# Patient Record
Sex: Female | Born: 1990 | Hispanic: Refuse to answer | Marital: Single | Smoking: Never smoker
Health system: Southern US, Community
[De-identification: ages and names within clinical notes are randomized; demographics above are authoritative.]

## PROBLEM LIST (undated history)

## (undated) DIAGNOSIS — I959 Hypotension, unspecified: Secondary | ICD-10-CM

## (undated) DIAGNOSIS — N83209 Unspecified ovarian cyst, unspecified side: Secondary | ICD-10-CM

## (undated) DIAGNOSIS — F32A Depression, unspecified: Secondary | ICD-10-CM

## (undated) DIAGNOSIS — R002 Palpitations: Secondary | ICD-10-CM

## (undated) DIAGNOSIS — N189 Chronic kidney disease, unspecified: Secondary | ICD-10-CM

## (undated) DIAGNOSIS — F4310 Post-traumatic stress disorder, unspecified: Secondary | ICD-10-CM

## (undated) DIAGNOSIS — K219 Gastro-esophageal reflux disease without esophagitis: Secondary | ICD-10-CM

## (undated) DIAGNOSIS — R519 Headache, unspecified: Secondary | ICD-10-CM

## (undated) DIAGNOSIS — F419 Anxiety disorder, unspecified: Secondary | ICD-10-CM

## (undated) DIAGNOSIS — J45909 Unspecified asthma, uncomplicated: Secondary | ICD-10-CM

## (undated) DIAGNOSIS — B009 Herpesviral infection, unspecified: Secondary | ICD-10-CM

## (undated) DIAGNOSIS — D649 Anemia, unspecified: Secondary | ICD-10-CM

## (undated) DIAGNOSIS — R748 Abnormal levels of other serum enzymes: Secondary | ICD-10-CM

## (undated) HISTORY — PX: CHOLECYSTECTOMY: SHX55

---

## 2022-03-01 ENCOUNTER — Encounter (HOSPITAL_COMMUNITY): Payer: Self-pay | Admitting: Obstetrics and Gynecology

## 2022-03-01 ENCOUNTER — Other Ambulatory Visit: Payer: Self-pay

## 2022-03-01 ENCOUNTER — Inpatient Hospital Stay (HOSPITAL_COMMUNITY)
Admission: AD | Admit: 2022-03-01 | Discharge: 2022-03-01 | Disposition: A | Discharge status: Home / Self Care | Payer: Medicaid Other | Attending: Obstetrics and Gynecology | Admitting: Obstetrics and Gynecology

## 2022-03-01 DIAGNOSIS — O99612 Diseases of the digestive system complicating pregnancy, second trimester: Secondary | ICD-10-CM | POA: Insufficient documentation

## 2022-03-01 DIAGNOSIS — O09212 Supervision of pregnancy with history of pre-term labor, second trimester: Secondary | ICD-10-CM | POA: Diagnosis not present

## 2022-03-01 DIAGNOSIS — K5900 Constipation, unspecified: Secondary | ICD-10-CM

## 2022-03-01 DIAGNOSIS — N3000 Acute cystitis without hematuria: Secondary | ICD-10-CM | POA: Diagnosis not present

## 2022-03-01 DIAGNOSIS — Z3A21 21 weeks gestation of pregnancy: Secondary | ICD-10-CM | POA: Diagnosis not present

## 2022-03-01 DIAGNOSIS — O2312 Infections of bladder in pregnancy, second trimester: Secondary | ICD-10-CM | POA: Insufficient documentation

## 2022-03-01 DIAGNOSIS — O212 Late vomiting of pregnancy: Secondary | ICD-10-CM | POA: Insufficient documentation

## 2022-03-01 HISTORY — DX: Hypotension, unspecified: I95.9

## 2022-03-01 HISTORY — DX: Anemia, unspecified: D64.9

## 2022-03-01 HISTORY — DX: Unspecified asthma, uncomplicated: J45.909

## 2022-03-01 HISTORY — DX: Unspecified ovarian cyst, unspecified side: N83.209

## 2022-03-01 HISTORY — DX: Palpitations: R00.2

## 2022-03-01 HISTORY — DX: Herpesviral infection, unspecified: B00.9

## 2022-03-01 LAB — URINALYSIS, ROUTINE W REFLEX MICROSCOPIC
Bilirubin Urine: NEGATIVE
Glucose, UA: NEGATIVE mg/dL
Hgb urine dipstick: NEGATIVE
Ketones, ur: NEGATIVE mg/dL
Nitrite: NEGATIVE
Protein, ur: NEGATIVE mg/dL
Specific Gravity, Urine: 1.018 (ref 1.005–1.030)
pH: 6 (ref 5.0–8.0)

## 2022-03-01 MED ORDER — CEFADROXIL 500 MG PO CAPS: 500.0000 mg | ORAL_CAPSULE | Freq: Two times a day (BID) | ORAL | 0 refills | Status: DC

## 2022-03-01 MED ORDER — BISACODYL 10 MG RE SUPP
10.0000 mg | RECTAL | 6 refills | Status: DC | PRN
Start: 2022-03-01 — End: 2022-03-10

## 2022-03-01 NOTE — MAU Provider Note (Signed)
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  Patient seen for constipation, which appears to be a chronic issue from before the pregnancy began.  This has been worsened by the patient taking Zofran due to her hyperemesis.  She has been trying to take MiraLAX and milk of magnesium without improvement.  She did have a small amount of stool last night and this afternoon.  OB History     Gravida  5   Para  1   Term      Preterm  1   AB  3   Living  1      SAB  3   IAB      Ectopic      Multiple      Live Births  1           Past Medical History:  Diagnosis Date   Anemia    Asthma    HSV-2 infection    Hypotension    Ovarian cyst    Palpitations     Past Surgical History:  Procedure Laterality Date   CHOLECYSTECTOMY      Family History  Problem Relation Age of Onset   Healthy Mother    Healthy Father     Social History   Tobacco Use   Smoking status: Never   Smokeless tobacco: Never  Vaping Use   Vaping Use: Never used  Substance Use Topics   Alcohol use: Not Currently    Comment: Occas when younger   Drug use: Never    Allergies:  Allergies  Allergen Reactions   Shellfish Allergy Anaphylaxis   Soy Allergy Anaphylaxis   Wheat Bran Anaphylaxis   Folic Acid Nausea And Vomiting    Reports vomits blood   Azithromycin Palpitations    Medications Prior to Admission  Medication Sig Dispense Refill Last Dose   ondansetron (ZOFRAN-ODT) 4 MG disintegrating tablet Take 4 mg by mouth every 8 (eight) hours as needed for nausea or vomiting.   02/28/2022    Review of Systems Physical Exam   Blood pressure (!) 106/57, pulse 99, temperature 98.8 F (37.1 C),  temperature source Oral, resp. rate 20, height 5' 5.5" (1.664 m), weight 119.3 kg, SpO2 98 %.  Physical Exam Vitals reviewed.  Constitutional:      Appearance: Normal appearance.  Cardiovascular:     Rate and Rhythm: Normal rate and regular rhythm.  Abdominal:     General: Abdomen is flat. There is no distension.     Palpations: Abdomen is soft.     Tenderness: There is abdominal tenderness. There is no guarding or rebound.  Skin:    General: Skin is warm and dry.     Capillary Refill: Capillary refill takes less than 2 seconds.  Neurological:     General: No focal deficit present.     Mental Status: She is alert.  Psychiatric:        Mood and Affect: Mood normal.        Behavior: Behavior normal.        Thought Content: Thought content normal.        Judgment: Judgment normal.   Results for orders placed or performed during the  hospital encounter of 03/01/22 (from the past 24 hour(s))  Urinalysis, Routine w reflex microscopic Urine, Clean Catch     Status: Abnormal   Collection Time: 03/01/22  6:39 PM  Result Value Ref Range   Color, Urine YELLOW YELLOW   APPearance CLOUDY (A) CLEAR   Specific Gravity, Urine 1.018 1.005 - 1.030   pH 6.0 5.0 - 8.0   Glucose, UA NEGATIVE NEGATIVE mg/dL   Hgb urine dipstick NEGATIVE NEGATIVE   Bilirubin Urine NEGATIVE NEGATIVE   Ketones, ur NEGATIVE NEGATIVE mg/dL   Protein, ur NEGATIVE NEGATIVE mg/dL   Nitrite NEGATIVE NEGATIVE   Leukocytes,Ua MODERATE (A) NEGATIVE   RBC / HPF 0-5 0 - 5 RBC/hpf   WBC, UA 11-20 0 - 5 WBC/hpf   Bacteria, UA RARE (A) NONE SEEN   Squamous Epithelial / LPF 6-10 0 - 5   Mucus PRESENT      MAU Course  Procedures  MDM  Assessment and Plan   1. [redacted] weeks gestation of pregnancy   2. Constipation, unspecified constipation type   3. Acute cystitis without hematuria    Will give bisacodyl suppository and have the patient continue with MiraLAX.  We will do this twice a day until the patient is using bathroom  frequently, then back to just the MiraLAX.  UA suggestive of UTI.  Will give cefadroxil twice daily for the next week.  Levie Heritage 03/01/2022, 7:07 PM

## 2022-03-01 NOTE — MAU Note (Addendum)
Chelsea Berry is a 31 y.o. at 22+6 here in MAU reporting: urinary frequency & urgency and constipation.  Reports when voiding , going small amounts and going more frequent.  States stings during urination.  Also reports had BM today, but small amt.  Reports last "good" BM was 1.5 months ago.  States was being worked up for [employer] disease prior to pregnancy.   LMP: December 2022 Onset of complaint: 2 days ago Pain score: 0 Vitals:   03/01/22 1815  BP: (!) 106/57  Pulse: 99  Resp: 20  Temp: 98.8 F (37.1 C)  SpO2: 98%     FHT: 154 bpm Lab orders placed from triage:   UA

## 2022-03-03 LAB — URINE CULTURE: Culture: NO GROWTH

## 2022-03-10 ENCOUNTER — Encounter: Payer: Self-pay | Admitting: Obstetrics and Gynecology

## 2022-03-10 ENCOUNTER — Ambulatory Visit (INDEPENDENT_AMBULATORY_CARE_PROVIDER_SITE_OTHER): Payer: Medicaid Other | Admitting: Obstetrics and Gynecology

## 2022-03-10 DIAGNOSIS — O09892 Supervision of other high risk pregnancies, second trimester: Secondary | ICD-10-CM | POA: Diagnosis not present

## 2022-03-10 DIAGNOSIS — B009 Herpesviral infection, unspecified: Secondary | ICD-10-CM | POA: Diagnosis not present

## 2022-03-10 DIAGNOSIS — Z8751 Personal history of pre-term labor: Secondary | ICD-10-CM

## 2022-03-10 DIAGNOSIS — R7989 Other specified abnormal findings of blood chemistry: Secondary | ICD-10-CM | POA: Diagnosis not present

## 2022-03-10 DIAGNOSIS — Z3A23 23 weeks gestation of pregnancy: Secondary | ICD-10-CM

## 2022-03-10 DIAGNOSIS — Z3480 Encounter for supervision of other normal pregnancy, unspecified trimester: Secondary | ICD-10-CM | POA: Insufficient documentation

## 2022-03-10 MED ORDER — PRENATAL ADULT GUMMY/DHA/FA 0.4-25 MG PO CHEW
CHEWABLE_TABLET | ORAL | 3 refills | Status: DC
Start: 2022-03-10 — End: 2022-03-12

## 2022-03-10 NOTE — Addendum Note (Signed)
Addended by: Hamilton Capri on: 03/10/2022 04:17 PM   Modules accepted: Orders

## 2022-03-10 NOTE — Progress Notes (Signed)
Subjective:  Chelsea Berry is a 31 y.o. [phone] at [redacted]w[redacted]d being seen today for ongoing prenatal care.  Tx from [facility]. She is currently monitored for the following issues for this high-risk pregnancy and has Supervision of other normal pregnancy, antepartum; History of preterm delivery; and Elevated LFTs on their problem list.  Patient reports general discomforts of pregnancy.  Contractions: Irritability. Vag. Bleeding: None.  Movement: Present. Denies leaking of fluid.   The following portions of the patient's history were reviewed and updated as appropriate: allergies, current medications, past family history, past medical history, past social history, past surgical history and problem list. Problem list updated.  Objective:   Vitals:   03/10/22 1455  BP: 110/71  Pulse: 85  Weight: 119.3 kg    Fetal Status: Fetal Heart Rate (bpm): 141   Movement: Present     General:  Alert, oriented and cooperative. Patient is in no acute distress.  Skin: Skin is warm and dry. No rash noted.   Cardiovascular: Normal heart rate noted  Respiratory: Normal respiratory effort, no problems with respiration noted  Abdomen: Soft, gravid, appropriate for gestational age. Pain/Pressure: Present     Pelvic:  Cervical exam deferred        Extremities: Normal range of motion.  Edema: Trace  Mental Status: Normal mood and affect. Normal behavior. Normal judgment and thought content.   Urinalysis:      Assessment and Plan:  Pregnancy: G5P0131 at [redacted]w[redacted]d  1. Supervision of other normal pregnancy, antepartum Prenatal care and labs reviewed with pt Glucola next visit - [place] MFM OB COMP + 14 WK; Future  2. History of preterm delivery SROM at 36 weeks. No treatment necessary  3. HSV-2 infection Suppression at 36 weeks  4. Elevated LFTs Repeat with Glucola labs Prior w/u negative  Preterm labor symptoms and general obstetric precautions including but not limited to vaginal bleeding, contractions, leaking of  fluid and fetal movement were reviewed in detail with the patient. Please refer to After Visit Summary for other counseling recommendations.  Return in about 4 weeks (around 04/07/2022) for OB visit, face to face, MD only, fasting for Glucola.   Chancy Milroy, MD

## 2022-03-10 NOTE — Patient Instructions (Signed)

## 2022-03-12 ENCOUNTER — Ambulatory Visit (HOSPITAL_COMMUNITY)
Admission: EM | Admit: 2022-03-12 | Discharge: 2022-03-12 | Disposition: A | Discharge status: Home / Self Care | Payer: Medicaid Other

## 2022-03-12 ENCOUNTER — Encounter (HOSPITAL_COMMUNITY): Payer: Self-pay | Admitting: Emergency Medicine

## 2022-03-12 DIAGNOSIS — R21 Rash and other nonspecific skin eruption: Secondary | ICD-10-CM | POA: Diagnosis not present

## 2022-03-12 MED ORDER — CLOTRIMAZOLE-BETAMETHASONE 1-0.05 % EX CREA: TOPICAL_CREAM | CUTANEOUS | 0 refills | Status: DC

## 2022-03-12 NOTE — ED Provider Notes (Signed)
[facility]    CSN: [id number] Arrival date & time: 03/12/22  1435     History   Chief Complaint Chief Complaint  Patient presents with   Rash    HPI Chelsea Berry is a 31 y.o. female.  Presents with rash to bottoms of feet. Got a pedicure yesterday and had some itching. Noticed rash to bottom of feet. Tried scrubbing it off, tried benadryl. Denies pain, itching, burning today. No fever, chills, shortness of breath, abdominal pain, vomiting/diarrhea. No rash anywhere else on the body. Currently 6 months pregnant.   Past Medical History:  Diagnosis Date   Anemia    Asthma    HSV-2 infection    Hypotension    Ovarian cyst    Palpitations     Patient Active Problem List   Diagnosis Date Noted   Supervision of other normal pregnancy, antepartum 03/10/2022   History of preterm delivery 03/10/2022   Elevated LFTs     Past Surgical History:  Procedure Laterality Date   CHOLECYSTECTOMY      OB History     Gravida  5   Para  1   Term      Preterm  1   AB  3   Living  1      SAB  3   IAB      Ectopic      Multiple      Live Births  1            Home Medications    Prior to Admission medications   Medication Sig Start Date End Date Taking? Authorizing Provider  clotrimazole-betamethasone (LOTRISONE) cream Apply to affected area 2 times daily for 5 days 03/12/22  Yes Yukiko Minnich, Lurena Joiner, PA-C  diphenhydrAMINE (BENADRYL) 25 MG tablet Take 25 mg by mouth every 6 (six) hours as needed.   Yes [provider]    Family History Family History  Problem Relation Age of Onset   Healthy Mother    Healthy Father     Social History Social History   Tobacco Use   Smoking status: Never   Smokeless tobacco: Never  Vaping Use   Vaping Use: Never used  Substance Use Topics   Alcohol use: Not Currently    Comment: Occas when younger   Drug use: Never     Allergies   Shellfish allergy, Soy allergy, Wheat bran, Folic acid, and  Azithromycin   Review of Systems Review of Systems  Skin:  Positive for rash.   As per HPI  Physical Exam Triage Vital Signs ED Triage Vitals  Enc Vitals Group     BP 03/12/22 1510 107/66     Pulse Rate 03/12/22 1510 93     Resp 03/12/22 1510 16     Temp 03/12/22 1510 98.4 F (36.9 C)     Temp Source 03/12/22 1510 Oral     SpO2 03/12/22 1510 99 %     Weight --      Height --      Head Circumference --      Peak Flow --      Pain Score 03/12/22 1515 0     Pain Loc --      Pain Edu? --      Excl. in GC? --    No data found.  Updated Vital Signs BP 107/66 (BP Location: Left Arm)   Pulse 93   Temp 98.4 F (36.9 C) (Oral)   Resp 16   LMP  09/30/2021 (Approximate)   SpO2 99%    Physical Exam Vitals and nursing note reviewed.  Constitutional:      General: She is not in acute distress.    Appearance: Normal appearance.  HENT:     Mouth/Throat:     Mouth: Mucous membranes are moist.     Pharynx: Oropharynx is clear. No posterior oropharyngeal erythema.  Eyes:     Conjunctiva/sclera: Conjunctivae normal.     Pupils: Pupils are equal, round, and reactive to light.  Cardiovascular:     Rate and Rhythm: Normal rate and regular rhythm.     Pulses: Normal pulses.     Heart sounds: Normal heart sounds.  Pulmonary:     Effort: Pulmonary effort is normal. No respiratory distress.     Breath sounds: Normal breath sounds. No wheezing.  Abdominal:     General: Bowel sounds are normal.     Tenderness: There is no abdominal tenderness.  Skin:    Findings: Rash present. No erythema or wound.     Comments: Yellow/brown macular rash to soles of feet (see photos). No erythema, warmth.  Neurological:     Mental Status: She is alert and oriented to person, place, and time.      UC Treatments / Results  Labs (all labs ordered are listed, but only abnormal results are displayed) Labs Reviewed - No data to display  EKG  Radiology No results  found.  Procedures Procedures (including critical care time)  Medications Ordered in UC Medications - No data to display  Initial Impression / Assessment and Plan / UC Course  I have reviewed the triage vital signs and the nursing notes.  Pertinent labs & imaging results that were available during my care of the patient were reviewed by me and considered in my medical decision making (see chart for details).  Possible chemical reaction to pedicure materials. Questioning chemical vs yeast. No symptoms with rash. We will try Lotrisone cream twice a day for 5 days. Discussed with patient to follow-up with her primary care or OB if the rash does not go away after the 5 days.  We discussed return precautions and signs of infection to look for.  Patient agrees to plan and is discharged in stable condition.  Final Clinical Impressions(s) / UC Diagnoses   Final diagnoses:  Rash of both feet     Discharge Instructions      Please use the cream on the affected areas of your feet twice a day. Use for 5 days. Follow up with your primary care if symptoms persist after 5 days.  Go to the emergency department if you develop signs of infection, shortness of breath, or worsening symptoms.      ED Prescriptions     Medication Sig Dispense Auth. Provider   clotrimazole-betamethasone (LOTRISONE) cream Apply to affected area 2 times daily for 5 days 15 g Ramere Downs, Lurena Joiner, PA-C      PDMP not reviewed this encounter.   Georgiann Neider, Lurena Joiner, [place] 03/12/22 1603

## 2022-03-12 NOTE — Discharge Instructions (Addendum)
Please use the cream on the affected areas of your feet twice a day. Use for 5 days. Follow up with your primary care if symptoms persist after 5 days.  Go to the emergency department if you develop signs of infection, shortness of breath, or worsening symptoms.

## 2022-03-12 NOTE — ED Triage Notes (Addendum)
Patient c/o rash that started this morning.   Patient endorses " I went and got a pedicure yesterday and felt some itching but no rash at first".   Patient endorses rash is present at the bottom of both feet.   Patient has tried washing feet with no relief of symptoms.   Patient has taken benadryl with no relief of symptoms.   Patient is approximately 6 months pregnant per patient statement.

## 2022-03-18 ENCOUNTER — Encounter: Payer: Medicaid Other | Admitting: Obstetrics

## 2022-03-21 ENCOUNTER — Encounter (HOSPITAL_COMMUNITY): Payer: Self-pay | Admitting: Obstetrics and Gynecology

## 2022-03-21 ENCOUNTER — Inpatient Hospital Stay (HOSPITAL_COMMUNITY): Payer: Medicaid Other

## 2022-03-21 ENCOUNTER — Inpatient Hospital Stay (HOSPITAL_COMMUNITY)
Admission: AD | Admit: 2022-03-21 | Discharge: 2022-03-21 | Disposition: A | Discharge status: Home / Self Care | Payer: Medicaid Other | Attending: Obstetrics and Gynecology | Admitting: Obstetrics and Gynecology

## 2022-03-21 DIAGNOSIS — O26892 Other specified pregnancy related conditions, second trimester: Secondary | ICD-10-CM | POA: Diagnosis present

## 2022-03-21 DIAGNOSIS — R1011 Right upper quadrant pain: Secondary | ICD-10-CM | POA: Insufficient documentation

## 2022-03-21 DIAGNOSIS — O26832 Pregnancy related renal disease, second trimester: Secondary | ICD-10-CM | POA: Diagnosis not present

## 2022-03-21 DIAGNOSIS — Z3A24 24 weeks gestation of pregnancy: Secondary | ICD-10-CM | POA: Insufficient documentation

## 2022-03-21 DIAGNOSIS — Z3689 Encounter for other specified antenatal screening: Secondary | ICD-10-CM

## 2022-03-21 DIAGNOSIS — Z9049 Acquired absence of other specified parts of digestive tract: Secondary | ICD-10-CM | POA: Diagnosis not present

## 2022-03-21 DIAGNOSIS — Z3480 Encounter for supervision of other normal pregnancy, unspecified trimester: Secondary | ICD-10-CM

## 2022-03-21 HISTORY — DX: Depression, unspecified: F32.A

## 2022-03-21 HISTORY — DX: Headache, unspecified: R51.9

## 2022-03-21 HISTORY — DX: Abnormal levels of other serum enzymes: R74.8

## 2022-03-21 HISTORY — DX: Post-traumatic stress disorder, unspecified: F43.10

## 2022-03-21 HISTORY — DX: Anxiety disorder, unspecified: F41.9

## 2022-03-21 HISTORY — DX: Chronic kidney disease, unspecified: N18.9

## 2022-03-21 HISTORY — DX: Gastro-esophageal reflux disease without esophagitis: K21.9

## 2022-03-21 LAB — CBC WITH DIFFERENTIAL/PLATELET
Abs Immature Granulocytes: 0.09 10*3/uL — ABNORMAL HIGH (ref 0.00–0.07)
Basophils Absolute: 0 10*3/uL (ref 0.0–0.1)
Basophils Relative: 0 %
Eosinophils Absolute: 0.1 10*3/uL (ref 0.0–0.5)
Eosinophils Relative: 1 %
HCT: 31.5 % — ABNORMAL LOW (ref 36.0–46.0)
Hemoglobin: 10.4 g/dL — ABNORMAL LOW (ref 12.0–15.0)
Immature Granulocytes: 1 %
Lymphocytes Relative: 18 %
Lymphs Abs: 2.6 10*3/uL (ref 0.7–4.0)
MCH: 29.3 pg (ref 26.0–34.0)
MCHC: 33 g/dL (ref 30.0–36.0)
MCV: 88.7 fL (ref 80.0–100.0)
Monocytes Absolute: 0.8 10*3/uL (ref 0.1–1.0)
Monocytes Relative: 6 %
Neutro Abs: 10.6 10*3/uL — ABNORMAL HIGH (ref 1.7–7.7)
Neutrophils Relative %: 74 %
Platelets: 276 10*3/uL (ref 150–400)
RBC: 3.55 MIL/uL — ABNORMAL LOW (ref 3.87–5.11)
RDW: 14 % (ref 11.5–15.5)
WBC: 14.2 10*3/uL — ABNORMAL HIGH (ref 4.0–10.5)
nRBC: 0 % (ref 0.0–0.2)

## 2022-03-21 LAB — URINALYSIS, ROUTINE W REFLEX MICROSCOPIC
Bilirubin Urine: NEGATIVE
Glucose, UA: NEGATIVE mg/dL
Hgb urine dipstick: NEGATIVE
Ketones, ur: NEGATIVE mg/dL
Nitrite: NEGATIVE
Protein, ur: NEGATIVE mg/dL
Specific Gravity, Urine: 1.02 (ref 1.005–1.030)
pH: 5 (ref 5.0–8.0)

## 2022-03-21 LAB — COMPREHENSIVE METABOLIC PANEL
ALT: 14 U/L (ref 0–44)
AST: 15 U/L (ref 15–41)
Albumin: 2.6 g/dL — ABNORMAL LOW (ref 3.5–5.0)
Alkaline Phosphatase: 70 U/L (ref 38–126)
Anion gap: 9 (ref 5–15)
BUN: 5 mg/dL — ABNORMAL LOW (ref 6–20)
CO2: 25 mmol/L (ref 22–32)
Calcium: 9.5 mg/dL (ref 8.9–10.3)
Chloride: 104 mmol/L (ref 98–111)
Creatinine, Ser: 0.56 mg/dL (ref 0.44–1.00)
GFR, Estimated: >60 mL/min (ref >60)
Glucose, Bld: 89 mg/dL (ref 70–99)
Potassium: 3.4 mmol/L — ABNORMAL LOW (ref 3.5–5.1)
Sodium: 138 mmol/L (ref 135–145)
Total Bilirubin: 0.3 mg/dL (ref 0.3–1.2)
Total Protein: 6.2 g/dL — ABNORMAL LOW (ref 6.5–8.1)

## 2022-03-21 LAB — LIPASE, BLOOD: Lipase: 29 U/L (ref 11–51)

## 2022-03-21 NOTE — MAU Note (Signed)
.  Chelsea Berry is a 31 y.o. at [redacted]w[redacted]d here in MAU reporting: she feels a tight band across her stomach that  has been constant for the last hour and sharp shooting pains up her abd about q 10 min.has been having pain and discomfort since last night Not feeling much fetal movement today. Had some clear discharge last night  but Denies any vag bleeding or discharge today.  Onset of complaint: last night Pain score: 8-9 Vitals:   03/21/22 1927  BP: 128/64  Pulse: (!) 102  Resp: 18  Temp: 98.7 F (37.1 C)     FHT:157 Lab orders placed from triage:

## 2022-03-21 NOTE — MAU Provider Note (Signed)
History     CSN: [id number]  Arrival date and time: 03/21/22 1909   None     Chief Complaint  Patient presents with   Abdominal Pain   Chelsea Berry is a 31 y.o. G5P0131 at [redacted]w[redacted]d who receives care at [facility].  She presents today for Abdominal Pain that started last night around 530pm.  [name] states "it feels like a tight band around my stomach and a sharp pain that goes from the bottom of my stomach to the top."  She states that when this pain occurs it "knocks the air out of me."  She reports the pain was occurring every 10 minutes, but has subsided since arrival at the hospital. She reports she has taken tylenol around the time of onset with no relief and noted no aggravating factors. However, she goes on to state that the pain was worsened with eating.  She notes that the pain is located on her right side only and reports that she had elevated liver enzymes. She reports having had a cholecystomy in April 2022, but reports her GI issues continued.  However, testing had to subside with discovery of pregnancy.  She endorses fetal movement and is unsure if she is having contractions.   6pm Ham and Cheese Sub with Lettuce Mayo, Honey mustard, Vinegar Chocolate cake     OB History     Gravida  5   Para  1   Term      Preterm  1   AB  3   Living  1      SAB  3   IAB      Ectopic      Multiple      Live Births  1           Past Medical History:  Diagnosis Date   Anemia    Anxiety    Asthma    Chronic kidney disease    states she has frequent UTIs and has had pyleo and kidney stones   Depression    Elevated liver enzymes    GERD (gastroesophageal reflux disease)    Headache    history of migraines in adolesence-none in many years per pt   HSV-2 infection    Hypotension    Ovarian cyst    Palpitations    Post traumatic stress disorder (PTSD)     Past Surgical History:  Procedure Laterality Date   CHOLECYSTECTOMY      Family History   Problem Relation Age of Onset   Healthy Mother    Healthy Father     Social History   Tobacco Use   Smoking status: Never   Smokeless tobacco: Never  Vaping Use   Vaping Use: Never used  Substance Use Topics   Alcohol use: Not Currently    Comment: Occas when younger   Drug use: Never    Allergies:  Allergies  Allergen Reactions   Shellfish Allergy Anaphylaxis   Soy Allergy Anaphylaxis   Wheat Bran Anaphylaxis   Latex Hives and Rash    States skin with slough with contact   Folic Acid Nausea And Vomiting    Reports vomits blood   Azithromycin Palpitations    Medications Prior to Admission  Medication Sig Dispense Refill Last Dose   clotrimazole-betamethasone (LOTRISONE) cream Apply to affected area 2 times daily for 5 days 15 g 0    diphenhydrAMINE (BENADRYL) 25 MG tablet Take 25 mg by mouth every 6 (six) hours as needed.  Review of Systems  Constitutional:  Negative for chills and fever.  Eyes:  Negative for visual disturbance.  Respiratory:  Negative for cough and shortness of breath.   Gastrointestinal:  Positive for abdominal pain and diarrhea (X 3 days loose). Negative for constipation, nausea and vomiting.  Genitourinary:  Negative for difficulty urinating, dysuria, vaginal bleeding and vaginal discharge (Clear discharge).  Neurological:  Positive for light-headedness ("On and off today and yesterday"). Negative for dizziness and headaches.   Physical Exam   Blood pressure 128/64, pulse (!) 102, temperature 98.7 F (37.1 C), resp. rate 18, height 5' 5.5" (1.664 m), weight 117.5 kg, last menstrual period 09/30/2021.  Physical Exam Vitals reviewed.  Constitutional:      Appearance: She is well-developed. She is obese.  HENT:     Head: Normocephalic and atraumatic.  Eyes:     Conjunctiva/sclera: Conjunctivae normal.  Cardiovascular:     Rate and Rhythm: Normal rate and regular rhythm.  Pulmonary:     Effort: Pulmonary effort is normal. No  respiratory distress.     Breath sounds: Normal breath sounds.  Abdominal:     General: Bowel sounds are normal.     Palpations: Abdomen is soft.     Tenderness: There is abdominal tenderness in the right upper quadrant and right lower quadrant. There is no right CVA tenderness or left CVA tenderness.     Hernia: No hernia is present.     Comments: Gravid, Appears LGA  Musculoskeletal:        General: Normal range of motion.     Cervical back: Normal range of motion.  Skin:    General: Skin is warm and dry.  Neurological:     Mental Status: She is alert and oriented to person, place, and time.  Psychiatric:        Mood and Affect: Mood normal.        Behavior: Behavior normal.     Fetal Assessment 150 bpm, Mod Var, -Decels, +Accels Toco: No ctx graphed  MAU Course   Results for orders placed or performed during the hospital encounter of 03/21/22 (from the past 24 hour(s))  Urinalysis, Routine w reflex microscopic Urine, Clean Catch     Status: Abnormal   Collection Time: 03/21/22  7:30 PM  Result Value Ref Range   Color, Urine YELLOW YELLOW   APPearance HAZY (A) CLEAR   Specific Gravity, Urine 1.020 1.005 - 1.030   pH 5.0 5.0 - 8.0   Glucose, UA NEGATIVE NEGATIVE mg/dL   Hgb urine dipstick NEGATIVE NEGATIVE   Bilirubin Urine NEGATIVE NEGATIVE   Ketones, ur NEGATIVE NEGATIVE mg/dL   Protein, ur NEGATIVE NEGATIVE mg/dL   Nitrite NEGATIVE NEGATIVE   Leukocytes,Ua MODERATE (A) NEGATIVE   RBC / HPF 0-5 0 - 5 RBC/hpf   WBC, UA 21-50 0 - 5 WBC/hpf   Bacteria, UA FEW (A) NONE SEEN   Squamous Epithelial / LPF 11-20 0 - 5   Mucus PRESENT    Ca Oxalate Crys, UA PRESENT   Comprehensive metabolic panel     Status: Abnormal   Collection Time: 03/21/22  9:29 PM  Result Value Ref Range   Sodium 138 135 - 145 mmol/L   Potassium 3.4 (L) 3.5 - 5.1 mmol/L   Chloride 104 98 - 111 mmol/L   CO2 25 22 - 32 mmol/L   Glucose, Bld 89 70 - 99 mg/dL   BUN 5 (L) 6 - 20 mg/dL   Creatinine,  Ser 7.68 0.44 -  1.00 mg/dL   Calcium 9.5 8.9 - 16.1 mg/dL   Total Protein 6.2 (L) 6.5 - 8.1 g/dL   Albumin 2.6 (L) 3.5 - 5.0 g/dL   AST 15 15 - 41 U/L   ALT 14 0 - 44 U/L   Alkaline Phosphatase 70 38 - 126 U/L   Total Bilirubin 0.3 0.3 - 1.2 mg/dL   GFR, Estimated >09 >60 mL/min   Anion gap 9 5 - 15  CBC with Differential/Platelet     Status: Abnormal   Collection Time: 03/21/22  9:29 PM  Result Value Ref Range   WBC 14.2 (H) 4.0 - 10.5 K/uL   RBC 3.55 (L) 3.87 - 5.11 MIL/uL   Hemoglobin 10.4 (L) 12.0 - 15.0 g/dL   HCT 45.4 (L) 09.8 - 11.9 %   MCV 88.7 80.0 - 100.0 fL   MCH 29.3 26.0 - 34.0 pg   MCHC 33.0 30.0 - 36.0 g/dL   RDW 14.7 82.9 - 56.2 %   Platelets 276 150 - 400 K/uL   nRBC 0.0 0.0 - 0.2 %   Neutrophils Relative % 74 %   Neutro Abs 10.6 (H) 1.7 - 7.7 K/uL   Lymphocytes Relative 18 %   Lymphs Abs 2.6 0.7 - 4.0 K/uL   Monocytes Relative 6 %   Monocytes Absolute 0.8 0.1 - 1.0 K/uL   Eosinophils Relative 1 %   Eosinophils Absolute 0.1 0.0 - 0.5 K/uL   Basophils Relative 0 %   Basophils Absolute 0.0 0.0 - 0.1 K/uL   Immature Granulocytes 1 %   Abs Immature Granulocytes 0.09 (H) 0.00 - 0.07 K/uL  Lipase, blood     Status: None   Collection Time: 03/21/22  9:29 PM  Result Value Ref Range   Lipase 29 11 - 51 U/L   US Abdomen Complete  Result Date: 03/21/2022 CLINICAL DATA:  Right upper quadrant abdominal pain.  Elevated LFTs. EXAM: ABDOMEN ULTRASOUND COMPLETE COMPARISON:  None Available. FINDINGS: Gallbladder: Cholecystectomy. Common bile duct: Diameter: 4 mm Liver: No focal lesion identified. Within normal limits in parenchymal echogenicity. Portal vein is patent on color Doppler imaging with normal direction of blood flow towards the liver. IVC: No abnormality visualized. Pancreas: Visualized portion unremarkable. Spleen: Size and appearance within normal limits. Right Kidney: Length: 12.1 cm. Echogenicity within normal limits. No mass or hydronephrosis visualized. Left  Kidney: Length: 12.1 cm. Echogenicity within normal limits. No mass or hydronephrosis visualized. Abdominal aorta: No aneurysm visualized. Other findings: None. IMPRESSION: Cholecystectomy, otherwise unremarkable abdominal ultrasound. Electronically Signed   By: Elgie Collard M.D.   On: 03/21/2022 21:45    MDM PE Labs:CBC/D, CMP, Lipase EFM Ultrasound Assessment and Plan  31 year old G5P0131  SIUP at 24.5 weeks Cat I FT RUQ Pain H/O Cholecystectomy    -POC Reviewed -Exam performed and findings discussed. -Reassured that no contractions palpated or graphed.  -Labs ordered -Patient reiterates no current pain despite tenderness with palpation. -NST Reactive for GA -Abdominal US, complete to be ordered. -Await results and review, with MD if necessary.  Cherre Robins MSN, CNM 03/21/2022, 8:21 PM   Reassessment (10:32 PM) -Results as above. -Provider to bedside to inform that no significant findings noted. -Reviewed possible causes including stretching of scar tissue within/around the cholecystectomy site or other musculoskeletal discomforts associated with pregnancy. -Discussed continued monitoring and initiation of documenting of symptoms. -Encouraged dietary modifications/avoidance of known foods that cause agitation of symptoms. -Instructed to follow up as scheduled. -Encouraged to call primary office  or return to MAU if symptoms worsen or with the onset of new symptoms. -Discharged to home in stable condition.  Cherre RobinsJessica L Zakyra Kukuk MSN, CNM Advanced Practice Provider, [facility]

## 2022-03-21 NOTE — MAU Note (Signed)
Pt states the sharp pain shooting up on L side is gone, abdominal "tightening" is loosing, and baby is very active audible and perceived by patient,

## 2022-03-23 ENCOUNTER — Telehealth: Payer: Self-pay

## 2022-03-23 LAB — CULTURE, OB URINE

## 2022-03-23 NOTE — Telephone Encounter (Signed)
Wants to go over her labs she feels that she needs an appt about her kidneys and liver

## 2022-03-24 ENCOUNTER — Telehealth: Payer: Self-pay | Admitting: *Deleted

## 2022-03-24 NOTE — Telephone Encounter (Signed)
Returned TC. Pt concerned about "liver" and "kidney" values and seeking an "emergency visit."  Pt reassured that all lab values have been reviewed by provider and that preeclampsia was ruled out. Encouraged to discuss further at appt 6/27. Advised to return to MAU for worsening symptoms, HA unrelieved by tylenol, visual changes, dizziness, RUQ abdominal pain. Pt verbalized understanding.

## 2022-03-31 ENCOUNTER — Other Ambulatory Visit: Payer: Self-pay | Admitting: *Deleted

## 2022-03-31 ENCOUNTER — Ambulatory Visit: Payer: Medicaid Other | Admitting: *Deleted

## 2022-03-31 ENCOUNTER — Encounter: Payer: Self-pay | Admitting: *Deleted

## 2022-03-31 ENCOUNTER — Ambulatory Visit: Payer: Medicaid Other | Attending: Obstetrics and Gynecology

## 2022-03-31 VITALS — BP 110/60 | HR 101

## 2022-03-31 DIAGNOSIS — Z8751 Personal history of pre-term labor: Secondary | ICD-10-CM | POA: Insufficient documentation

## 2022-03-31 DIAGNOSIS — Z362 Encounter for other antenatal screening follow-up: Secondary | ICD-10-CM

## 2022-03-31 DIAGNOSIS — O09212 Supervision of pregnancy with history of pre-term labor, second trimester: Secondary | ICD-10-CM | POA: Diagnosis not present

## 2022-03-31 DIAGNOSIS — Z3480 Encounter for supervision of other normal pregnancy, unspecified trimester: Secondary | ICD-10-CM

## 2022-03-31 DIAGNOSIS — O99212 Obesity complicating pregnancy, second trimester: Secondary | ICD-10-CM | POA: Insufficient documentation

## 2022-03-31 DIAGNOSIS — R7989 Other specified abnormal findings of blood chemistry: Secondary | ICD-10-CM

## 2022-03-31 DIAGNOSIS — O09292 Supervision of pregnancy with other poor reproductive or obstetric history, second trimester: Secondary | ICD-10-CM | POA: Diagnosis not present

## 2022-03-31 DIAGNOSIS — Z3A26 26 weeks gestation of pregnancy: Secondary | ICD-10-CM | POA: Insufficient documentation

## 2022-04-06 ENCOUNTER — Encounter: Payer: Medicaid Other | Admitting: Obstetrics and Gynecology

## 2022-04-06 ENCOUNTER — Other Ambulatory Visit: Payer: Medicaid Other

## 2022-04-28 ENCOUNTER — Ambulatory Visit: Payer: Medicaid Other

## 2022-11-24 IMAGING — US US MFM OB DETAIL+14 WK
1 series · 13 of 28 positions shown · non-contrast
Comparison: none

[Series 1: us mfm ob detail+14 wk · 13 of 79 slices shown]
[im 3/79]
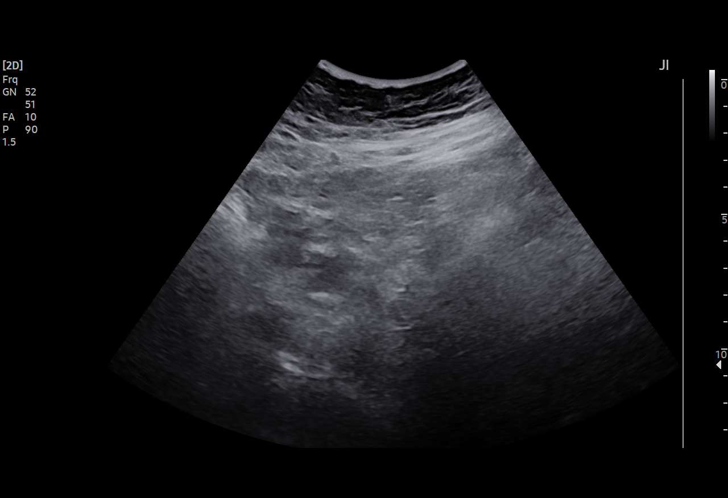
[im 9/79]
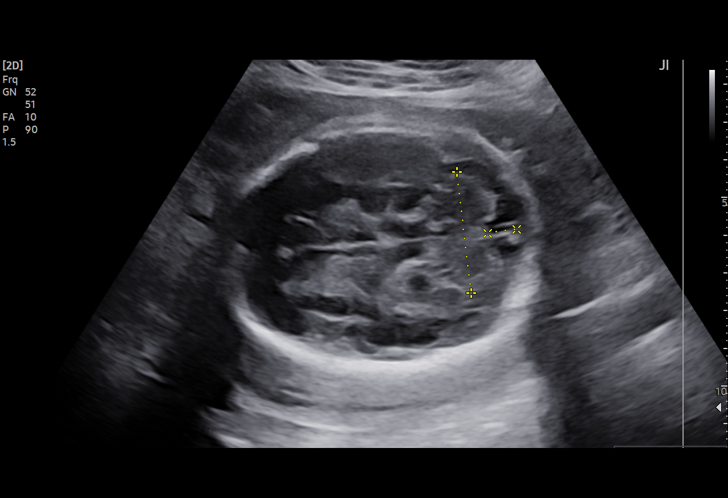
[im 15/79]
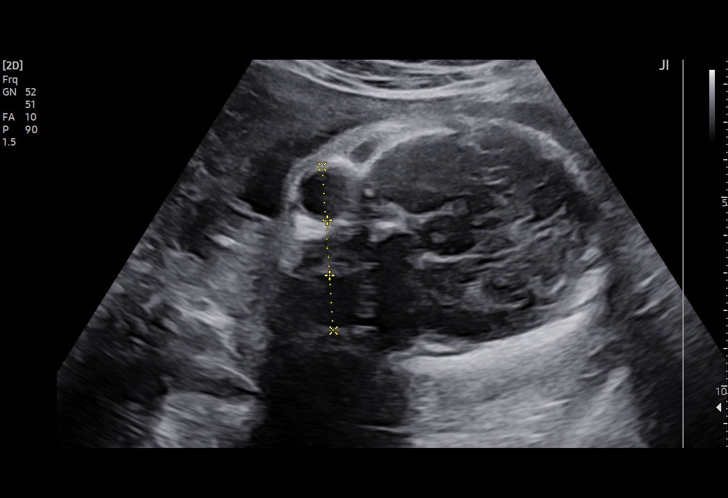
[im 21/79]
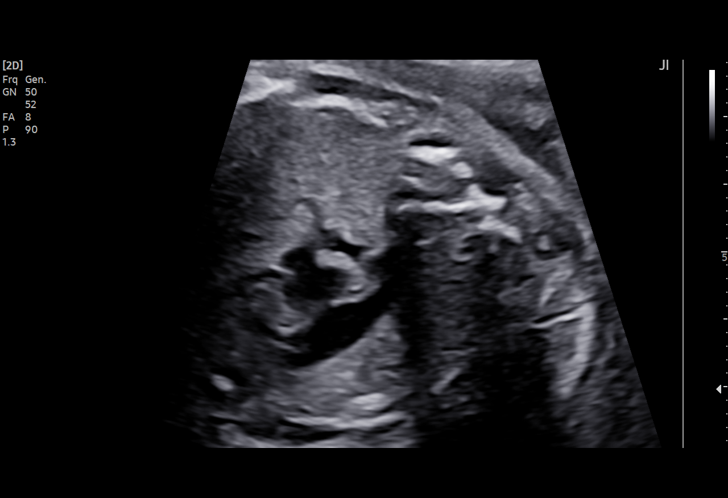
[im 27/79]
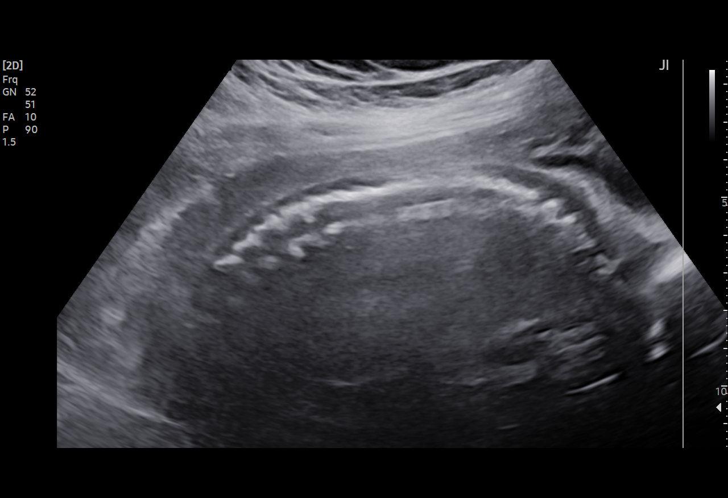
[im 32/79]
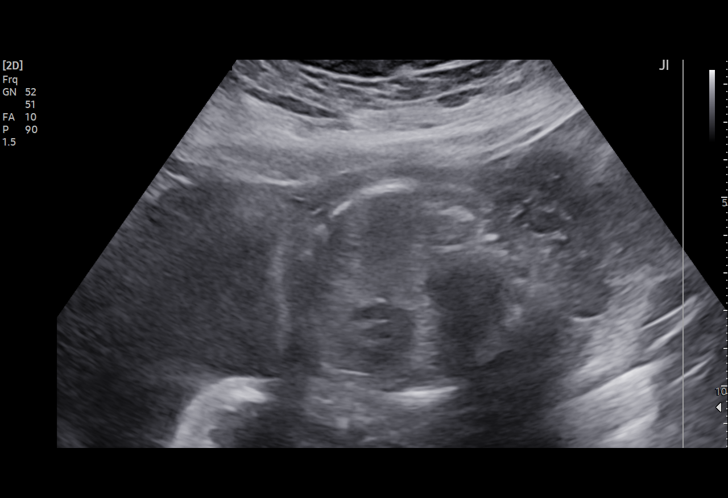
[im 41/79]
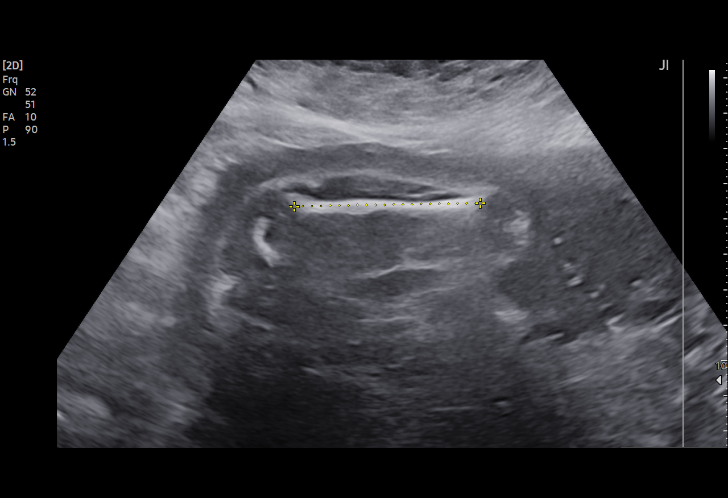
[im 47/79]
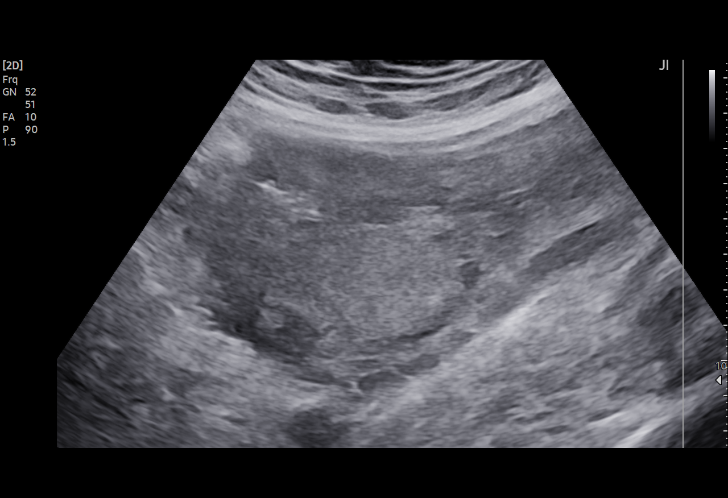
[im 53/79]
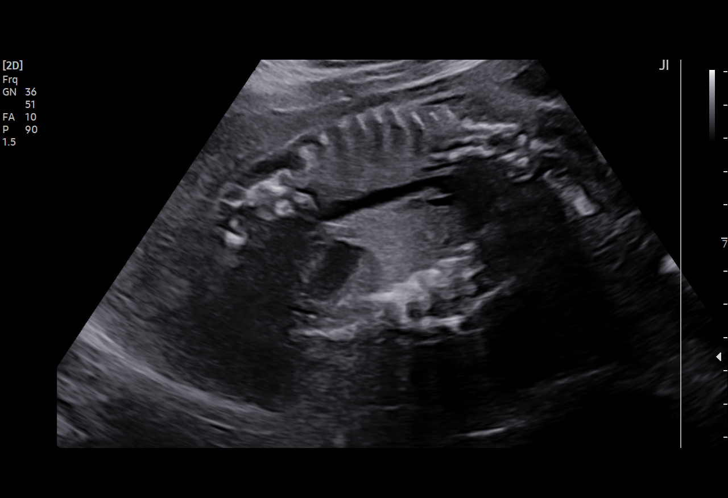
[im 58/79]
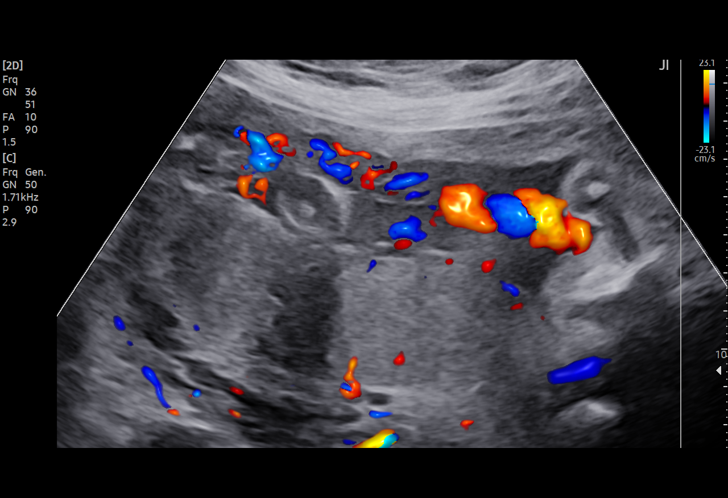
[im 64/79]
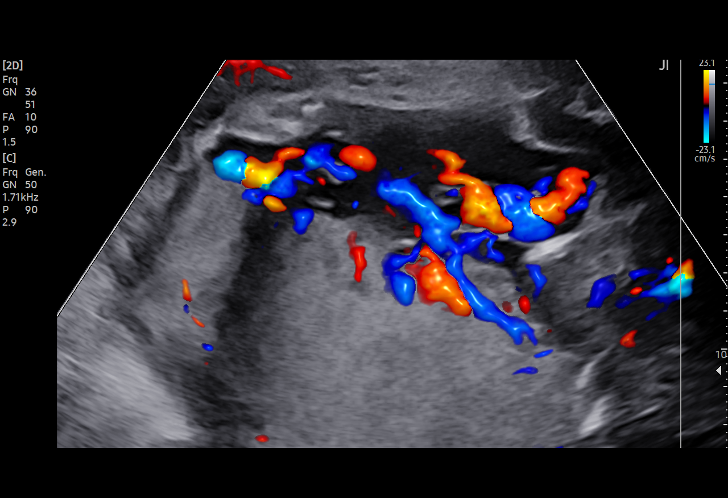
[im 70/79]
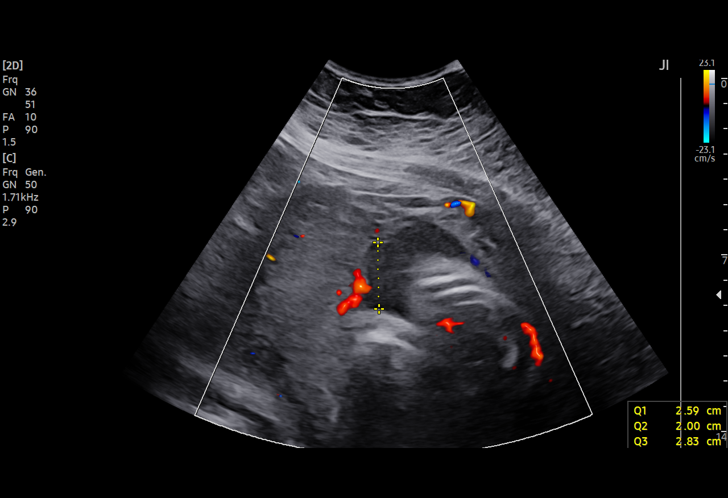
[im 76/79]
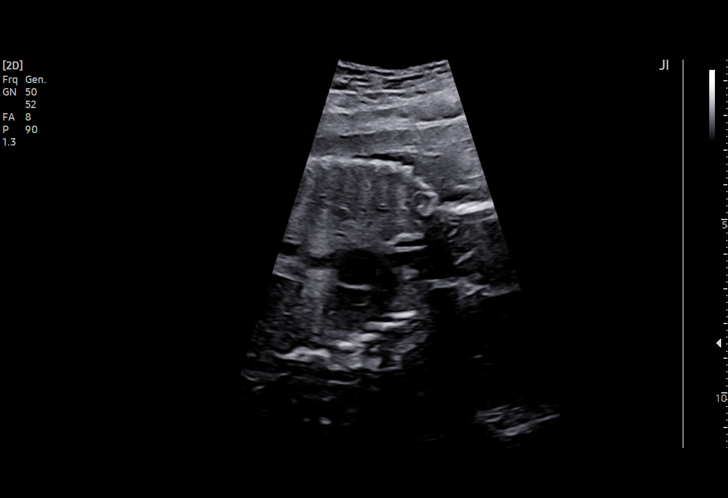

[13 of 28 positions shown; findings below may reference images not displayed]

Indications

 Obesity complicating pregnancy, second
 trimester (BMI 45.87)
 Poor obstetric history: Previous preterm
 delivery, antepartum
 26 weeks gestation of pregnancy
 Elevated LFTS
Vital Signs

 BP:          110/60
Fetal Evaluation

 Num Of Fetuses:         1
 Fetal Heart Rate(bpm):  142
 Cardiac Activity:       Observed
 Presentation:           Cephalic
 Placenta:               Posterior
 P. Cord Insertion:      Marginal insertion ?

 Amniotic Fluid
 AFI FV:      Within normal limits

 AFI Sum(cm)     %Tile       Largest Pocket(cm)
 10.05           8

 RUQ(cm)       RLQ(cm)       LUQ(cm)        LLQ(cm)
 2.59          2.63          2
 Comment:    Placenta cord insert is difficult to visualize due to fetal lie
Biometry

 BPD:     65.72  mm     G. Age:  26w 4d         58  %    CI:        67.04   %    70 - 86
                                                         FL/HC:      20.3   %    18.6 -
 HC:      257.1  mm     G. Age:  28w 0d         87  %    HC/AC:      1.15        1.04 -
 AC:    223.62   mm     G. Age:  26w 5d         66  %    FL/BPD:     79.2   %    71 - 87
 FL:      52.07  mm     G. Age:  27w 5d         85  %    FL/AC:      23.3   %    20 - 24
 HUM:        46  mm     G. Age:  27w 1d         72  %
 CER:      32.3  mm     G. Age:  27w 5d         86  %

 LV:          5  mm
 CM:        7.7  mm
 IOD:      14.6  mm     G. Age:  21w 5d         29  %
 OOD:      43.4  mm     G. Age:  24w 6d         62  %

 Est. FW:    9288  gm      2 lb 5 oz     87  %
OB History

 Gravidity:    5         Term:   1         SAB:   3
 Living:       1
Gestational Age

 LMP:           26w 0d        Date:  09/30/21                 EDD:   07/07/22
 U/S Today:     27w 2d                                        EDD:   06/28/22
 Best:          26w 0d     Det. By:  LMP  (09/30/21)          EDD:   07/07/22
Anatomy

 Cranium:               Appears normal         LVOT:                   Not well visualized
 Cavum:                 Appears normal         Aortic Arch:            Not well visualized
 Ventricles:            Appears normal         Ductal Arch:            Not well visualized
 Choroid Plexus:        Appears normal         Diaphragm:              Appears normal
 Cerebellum:            Appears normal         Stomach:                Appears normal, left
                                                                       sided
 Posterior Fossa:       Appears normal         Abdomen:                Appears normal
 Nuchal Fold:           Not applicable (>20    Abdominal Wall:         Appears nml (cord
                        wks GA)                                        insert, abd wall)
 Face:                  Appears normal         Cord Vessels:           Appears normal (3
                        (orbits and profile)                           vessel cord)
 Lips:                  Appears normal         Kidneys:                Appear normal
 Palate:                Not well visualized    Bladder:                Appears normal
 Thoracic:              Appears normal         Spine:                  Not well visualized
 Heart:                 Appears normal         Upper Extremities:      Visualized
                        (4CH, axis, and
                        situs)
 RVOT:                  Not well visualized    Lower Extremities:      Visualized

 Other:  Fetus appears to be female.VC, 3VV and 3VTV visualized. Nasal
         bone, lenses, maxilla, mandible and falx visualized
Targeted Anatomy

 Thorax
 SVC:                   Appears normal         IVC:                    Appears normal
 Abdomen
 Situs:                 Within normal limits

 Extremities
 Lt Hand:               Not well visualized    Rt Hand:                Not well visualized
Comments

 This patient was seen for a detailed fetal anatomy scan due
 to maternal obesity with a BMI of 46.
 She denies any significant past medical history and denies
 any problems in her current pregnancy.
 She has not had any screening tests for fetal aneuploidy
 drawn in her current pregnancy.
 She was informed that the fetal growth and amniotic fluid
 level were appropriate for her gestational age.
 The views of the fetal anatomy were limited today due to
 maternal body habitus in the fetal position.
 The patient was informed that anomalies may be missed due
 to technical limitations. If the fetus is in a suboptimal position
 or maternal habitus is increased, visualization of the fetus in
 the maternal uterus may be impaired.
 A follow-up exam was scheduled in 4 weeks to complete the
 views of the fetal anatomy.
